# Patient Record
Sex: Female | Born: 1985 | Race: White | Hispanic: No | Marital: Married | Smoking: Current some day smoker
Health system: Southern US, Community
[De-identification: ages and names within clinical notes are randomized; demographics above are authoritative.]

## PROBLEM LIST (undated history)

## (undated) DIAGNOSIS — E0500 Thyrotoxicosis with diffuse goiter without thyrotoxic crisis or storm: Secondary | ICD-10-CM

## (undated) HISTORY — PX: TONSILLECTOMY: SUR1361

---

## 2017-09-12 ENCOUNTER — Ambulatory Visit
Admission: EM | Admit: 2017-09-12 | Discharge: 2017-09-12 | Disposition: A | Discharge status: Home / Self Care | Payer: 59 | Attending: Emergency Medicine | Admitting: Emergency Medicine

## 2017-09-12 ENCOUNTER — Encounter: Payer: Self-pay | Admitting: Emergency Medicine

## 2017-09-12 ENCOUNTER — Other Ambulatory Visit: Payer: Self-pay

## 2017-09-12 DIAGNOSIS — N390 Urinary tract infection, site not specified: Secondary | ICD-10-CM

## 2017-09-12 HISTORY — DX: Thyrotoxicosis with diffuse goiter without thyrotoxic crisis or storm: E05.00

## 2017-09-12 LAB — URINALYSIS, COMPLETE (UACMP) WITH MICROSCOPIC
BACTERIA UA: NONE SEEN
Bilirubin Urine: NEGATIVE
GLUCOSE, UA: NEGATIVE mg/dL
Hgb urine dipstick: NEGATIVE
KETONES UR: NEGATIVE mg/dL
Nitrite: NEGATIVE
PROTEIN: NEGATIVE mg/dL
RBC / HPF: NONE SEEN RBC/hpf (ref 0–5)
Specific Gravity, Urine: 1.01 (ref 1.005–1.030)
pH: 7 (ref 5.0–8.0)

## 2017-09-12 MED ORDER — PHENAZOPYRIDINE HCL 200 MG PO TABS: 200.0000 mg | ORAL_TABLET | Freq: Three times a day (TID) | ORAL | 0 refills | Status: DC | PRN

## 2017-09-12 MED ORDER — NITROFURANTOIN MONOHYD MACRO 100 MG PO CAPS: 100.0000 mg | ORAL_CAPSULE | Freq: Two times a day (BID) | ORAL | 0 refills | Status: DC

## 2017-09-12 NOTE — ED Triage Notes (Signed)
Patient c/o burning when urinating since Tuesday.  

## 2017-09-12 NOTE — ED Provider Notes (Signed)
HPI  SUBJECTIVE:  Kerry BlonderVannessa Reed is a 32 y.o. female who presents with dysuria, cloudy and odorous urine for the past 2 days.  Tried increasing her fluids with improvement in her symptoms.  Symptoms worse with urination.  She denies urgency, frequency, hematuria.  She reports intermittent low midline nonradiating, crampy abdominal pain last night which has since resolved.  No vaginal odor, discharge, bleeding, genital rash, itching.  No back, pelvic pain.  No fevers.  She is in a long-term monogamous relationship with a female who is asymptomatic.  STDs are not a concern today.  No recent antibiotics, perfumed soaps, antipyretic in the past 6-8 hours.  She has a past medical history of UTIs, nonobstructing nephrolithiasis, Graves' disease.  No history of pyelonphritis, diabetes, hypertension.  No history of gonorrhea, chlamydia, HIV, HSV, syphilis, Trichomonas, BV.  She has had yeast infections before.  States this feels identical to previous UTIs.  LMP: 12/30.  Denies possibility being pregnant.  JXB:JYNWGNPMD:[place], [facility]     Past Medical History:  Diagnosis Date  . Graves disease     Past Surgical History:  Procedure Laterality Date  . TONSILLECTOMY      Family History  Problem Relation Age of Onset  . Thyroid disease Mother   . Mitral valve prolapse Mother     Social History   Tobacco Use  . Smoking status: Current Some Day Smoker    Types: E-cigarettes  . Smokeless tobacco: Never Used  Substance Use Topics  . Alcohol use: No    Frequency: Never  . Drug use: No    No current facility-administered medications for this encounter.  No current outpatient medications on file.  Allergies  Allergen Reactions  . Augmentin [Amoxicillin-Pot Clavulanate] Other (See Comments)    unknown  . Bactrim [Sulfamethoxazole-Trimethoprim] Other (See Comments)    unknown  . Erythromycin Other (See Comments)    unknown     ROS  As noted in HPI.   Physical Exam  BP 112/74 (BP  Location: Left Arm)   Pulse 74   Temp 98.2 F (36.8 C) (Oral)   Resp 14   Ht 5\' 7"  (1.702 m)   Wt 154 lb (69.9 kg)   LMP 08/26/2017 (Approximate)   SpO2 100%   BMI 24.12 kg/m   Constitutional: Well developed, well nourished, no acute distress Eyes:  EOMI, conjunctiva normal bilaterally HENT: Normocephalic, atraumatic,mucus membranes moist Respiratory: Normal inspiratory effort Cardiovascular: Normal rate GI: nondistended soft, no suprapubic or flank tenderness Back: No CVA tenderness skin: No rash, skin intact Musculoskeletal: no deformities Neurologic: Alert & oriented x 3, no focal neuro deficits Psychiatric: Speech and behavior appropriate   ED Course   Medications - No data to display  Orders Placed This Encounter  Procedures  . Urinalysis, Complete w Microscopic    Standing Status:   Standing    Number of Occurrences:   1    Results for orders placed or performed during the hospital encounter of 09/12/17 (from the past 24 hour(s))  Urinalysis, Complete w Microscopic     Status: Abnormal   Collection Time: 09/12/17  8:15 AM  Result Value Ref Range   Color, Urine YELLOW YELLOW   APPearance CLEAR CLEAR   Specific Gravity, Urine 1.010 1.005 - 1.030   pH 7.0 5.0 - 8.0   Glucose, UA NEGATIVE NEGATIVE mg/dL   Hgb urine dipstick NEGATIVE NEGATIVE   Bilirubin Urine NEGATIVE NEGATIVE   Ketones, ur NEGATIVE NEGATIVE mg/dL   Protein, ur NEGATIVE  NEGATIVE mg/dL   Nitrite NEGATIVE NEGATIVE   Leukocytes, UA TRACE (A) NEGATIVE   Squamous Epithelial / LPF 0-5 (A) NONE SEEN   WBC, UA 0-5 0 - 5 WBC/hpf   RBC / HPF NONE SEEN 0 - 5 RBC/hpf   Bacteria, UA NONE SEEN NONE SEEN   No results found.  ED Clinical Impression  Urinary tract infection without hematuria, site unspecified   ED Assessment/Plan  Patient with trace leukocytes, negative nitrite and no bacteria.  However given her symptoms, we will treat this as if this is a UTI.  Home with Macrobid, Pyridium,  continue pushing fluids, ibuprofen/Tylenol as needed.  If she does not get better, she may return here and we can consider other causes of dysuria such as gynecologic infections at that time.  Will send this off for culture to confirm presence of UTI and sensitivities.  Discussed this with patient.  She is amenable to this plan  Discussed labs, MDM, plan and followup with patient. Discussed sn/sx that should prompt return to the ED. patient agrees with plan.   No orders of the defined types were placed in this encounter.   *This clinic note was created using Dragon dictation software. Therefore, there may be occasional mistakes despite careful proofreading.   ?   Domenick Gong, MD 09/12/17 [phone]

## 2017-09-14 LAB — URINE CULTURE
Culture: 50000 — AB
Special Requests: NORMAL

## 2017-11-06 ENCOUNTER — Other Ambulatory Visit: Payer: Self-pay | Admitting: Family Medicine

## 2017-11-06 ENCOUNTER — Ambulatory Visit
Admission: RE | Admit: 2017-11-06 | Discharge: 2017-11-06 | Disposition: A | Discharge status: Home / Self Care | Payer: 59 | Source: Ambulatory Visit | Attending: Family Medicine | Admitting: Family Medicine

## 2017-11-06 DIAGNOSIS — M542 Cervicalgia: Secondary | ICD-10-CM

## 2018-04-28 ENCOUNTER — Encounter: Payer: Self-pay | Admitting: Emergency Medicine

## 2018-04-28 ENCOUNTER — Ambulatory Visit
Admission: EM | Admit: 2018-04-28 | Discharge: 2018-04-28 | Disposition: A | Discharge status: Home / Self Care | Payer: 59 | Attending: Family Medicine | Admitting: Family Medicine

## 2018-04-28 ENCOUNTER — Other Ambulatory Visit: Payer: Self-pay

## 2018-04-28 DIAGNOSIS — R350 Frequency of micturition: Secondary | ICD-10-CM | POA: Diagnosis not present

## 2018-04-28 DIAGNOSIS — R300 Dysuria: Secondary | ICD-10-CM | POA: Diagnosis not present

## 2018-04-28 LAB — URINALYSIS, COMPLETE (UACMP) WITH MICROSCOPIC
Bacteria, UA: NONE SEEN
Bilirubin Urine: NEGATIVE
Glucose, UA: NEGATIVE mg/dL
Ketones, ur: NEGATIVE mg/dL
Nitrite: NEGATIVE
Protein, ur: NEGATIVE mg/dL
RBC / HPF: NONE SEEN RBC/hpf (ref 0–5)
Specific Gravity, Urine: <1.005 — ABNORMAL LOW (ref 1.005–1.030)
Squamous Epithelial / LPF: NONE SEEN (ref 0–5)
pH: 5.5 (ref 5.0–8.0)

## 2018-04-28 MED ORDER — NITROFURANTOIN MONOHYD MACRO 100 MG PO CAPS: 100.0000 mg | ORAL_CAPSULE | Freq: Two times a day (BID) | ORAL | 0 refills | Status: AC

## 2018-04-28 NOTE — ED Provider Notes (Signed)
[facility] ____________________________________________  Time seen: Approximately 11:03 AM  I have reviewed the triage vital signs and the nursing notes.   HISTORY  Chief Complaint Dysuria   HPI Kerry Reed is a 32 y.o. female presenting for evaluation of dysuria present for the last 2 days.  Describes dysuria symptoms including burning and painful urination, slight urinary frequency.  States does often have some low back pain but denies any acute changes to low back pain.  Denies abdominal pain, fevers, vomiting or diarrhea.  Reports recent sexual intercourse with her spouse and states that she does not believe that she urinated afterwards and concerned this may have led to UTI.  States history of similar with similar presentation in similar circumstances.  States urine also smells odorous.  Denies any vaginal discharge, vaginal discomfort, vaginal sores, concerns of STDs or vaginal odor.  Denies recent antibiotic use.  Denies other aggravating alleviating factors.  Reports otherwise feels well.  Patient's last menstrual period was 04/14/2018 (approximate).Denies pregnancy.   Past Medical History:  Diagnosis Date  . Graves disease     There are no active problems to display for this patient.   Past Surgical History:  Procedure Laterality Date  . TONSILLECTOMY       No current facility-administered medications for this encounter.   Current Outpatient Medications:  .  nitrofurantoin, macrocrystal-monohydrate, (MACROBID) 100 MG capsule, Take 1 capsule (100 mg total) by mouth 2 (two) times daily., Disp: 10 capsule, Rfl: 0  Allergies Augmentin [amoxicillin-pot clavulanate]; Bactrim [sulfamethoxazole-trimethoprim]; Ceclor [cefaclor]; and Erythromycin  Family History  Problem Relation Age of Onset  . Thyroid disease Mother   . Mitral valve prolapse Mother     Social History Social History   Tobacco Use  . Smoking status: Current Some Day Smoker   Types: E-cigarettes, Cigarettes  . Smokeless tobacco: Never Used  Substance Use Topics  . Alcohol use: Yes    Frequency: Never    Comment: occasionally   . Drug use: No    Review of Systems Constitutional: No fever/chills Cardiovascular: Denies chest pain. Respiratory: Denies shortness of breath. Gastrointestinal: No abdominal pain.  No nausea, no vomiting.  No diarrhea.  No constipation. Genitourinary: positive for dysuria. Musculoskeletal: Negative for back pain. Skin: Negative for rash.  ____________________________________________   PHYSICAL EXAM:  VITAL SIGNS: ED Triage Vitals  Enc Vitals Group     BP 04/28/18 1007 112/77     Pulse Rate 04/28/18 1007 72     Resp 04/28/18 1007 17     Temp 04/28/18 1007 98.1 F (36.7 C)     Temp Source 04/28/18 1007 Oral     SpO2 04/28/18 1007 100 %     Weight 04/28/18 1006 153 lb (69.4 kg)     Height 04/28/18 1006 5\' 7"  (1.702 m)     Head Circumference --      Peak Flow --      Pain Score 04/28/18 1005 3     Pain Loc --      Pain Edu? --      Excl. in GC? --     Constitutional: Alert and oriented. Well appearing and in no acute distress. ENT      Head: Normocephalic and atraumatic. Cardiovascular: Normal rate, regular rhythm. Grossly normal heart sounds.  Good peripheral circulation. Respiratory: Normal respiratory effort without tachypnea nor retractions. Breath sounds are clear and equal bilaterally. No wheezes, rales, rhonchi. Gastrointestinal: Soft and nontender.  No CVA tenderness. Musculoskeletal: No midline cervical, thoracic or  lumbar tenderness to palpation Neurologic:  Normal speech and language.Speech is normal. No gait instability.  Skin:  Skin is warm, dry and intact. No rash noted. Psychiatric: Mood and affect are normal. Speech and behavior are normal. Patient exhibits appropriate insight and judgment   ___________________________________________   LABS (all labs ordered are listed, but only abnormal results  are displayed)  Labs Reviewed  URINALYSIS, COMPLETE (UACMP) WITH MICROSCOPIC - Abnormal; Notable for the following components:      Result Value   Color, Urine STRAW (*)    Specific Gravity, Urine <1.005 (*)    Hgb urine dipstick TRACE (*)    Leukocytes, UA MODERATE (*)    All other components within normal limits  URINE CULTURE    PROCEDURES Procedures   INITIAL IMPRESSION / ASSESSMENT AND PLAN / ED COURSE  Pertinent labs & imaging results that were available during my care of the patient were reviewed by me and considered in my medical decision making (see chart for details).  Well-appearing patient.  No acute distress.  Dysuria symptoms.  Urinalysis reviewed, discussed with patient not clear UTI, will culture urine.  Patient denies any vaginal complaints.  Suspicious for UTI.  Will empirically start oral Macrobid and await urine culture.  Encourage rest, fluids, supportive care.Discussed indication, risks and benefits of medications with patient.  Discussed follow up with Primary care physician this week. Discussed follow up and return parameters including no resolution or any worsening concerns. Patient verbalized understanding and agreed to plan.   ____________________________________________   FINAL CLINICAL IMPRESSION(S) / ED DIAGNOSES  Final diagnoses:  Dysuria     ED Discharge Orders         Ordered    nitrofurantoin, macrocrystal-monohydrate, (MACROBID) 100 MG capsule  2 times daily     04/28/18 1045           Note: This dictation was prepared with Dragon dictation along with smaller phrase technology. Any transcriptional errors that result from this process are unintentional.         Renford Dills, NP 04/28/18 1106

## 2018-04-28 NOTE — ED Triage Notes (Signed)
Patient c/o dysuria that started 2 days ago.

## 2018-04-28 NOTE — Discharge Instructions (Addendum)
Take medication as prescribed. Rest. Drink plenty of fluids.  ° °Follow up with your primary care physician this week as needed. Return to Urgent care for new or worsening concerns.  ° °

## 2018-05-01 ENCOUNTER — Telehealth (HOSPITAL_COMMUNITY): Payer: Self-pay

## 2018-05-01 LAB — URINE CULTURE: Culture: >=100000 — AB

## 2018-05-01 NOTE — Telephone Encounter (Signed)
Urine culture positive for e.coli this was treated with macrobid at ucc visit. Pt called and made aware.

## 2019-01-18 IMAGING — CR DG CERVICAL SPINE FLEX&EXT ONLY
3 series · 3 of 3 positions shown · non-contrast
Comparison: None.

CLINICAL DATA: Neck pain, bilateral.

EXAM:
CERVICAL SPINE - 2-3 VIEW; CERVICAL SPINE - FLEXION AND EXTENSION
VIEWS ONLY

[ct-spine lat]
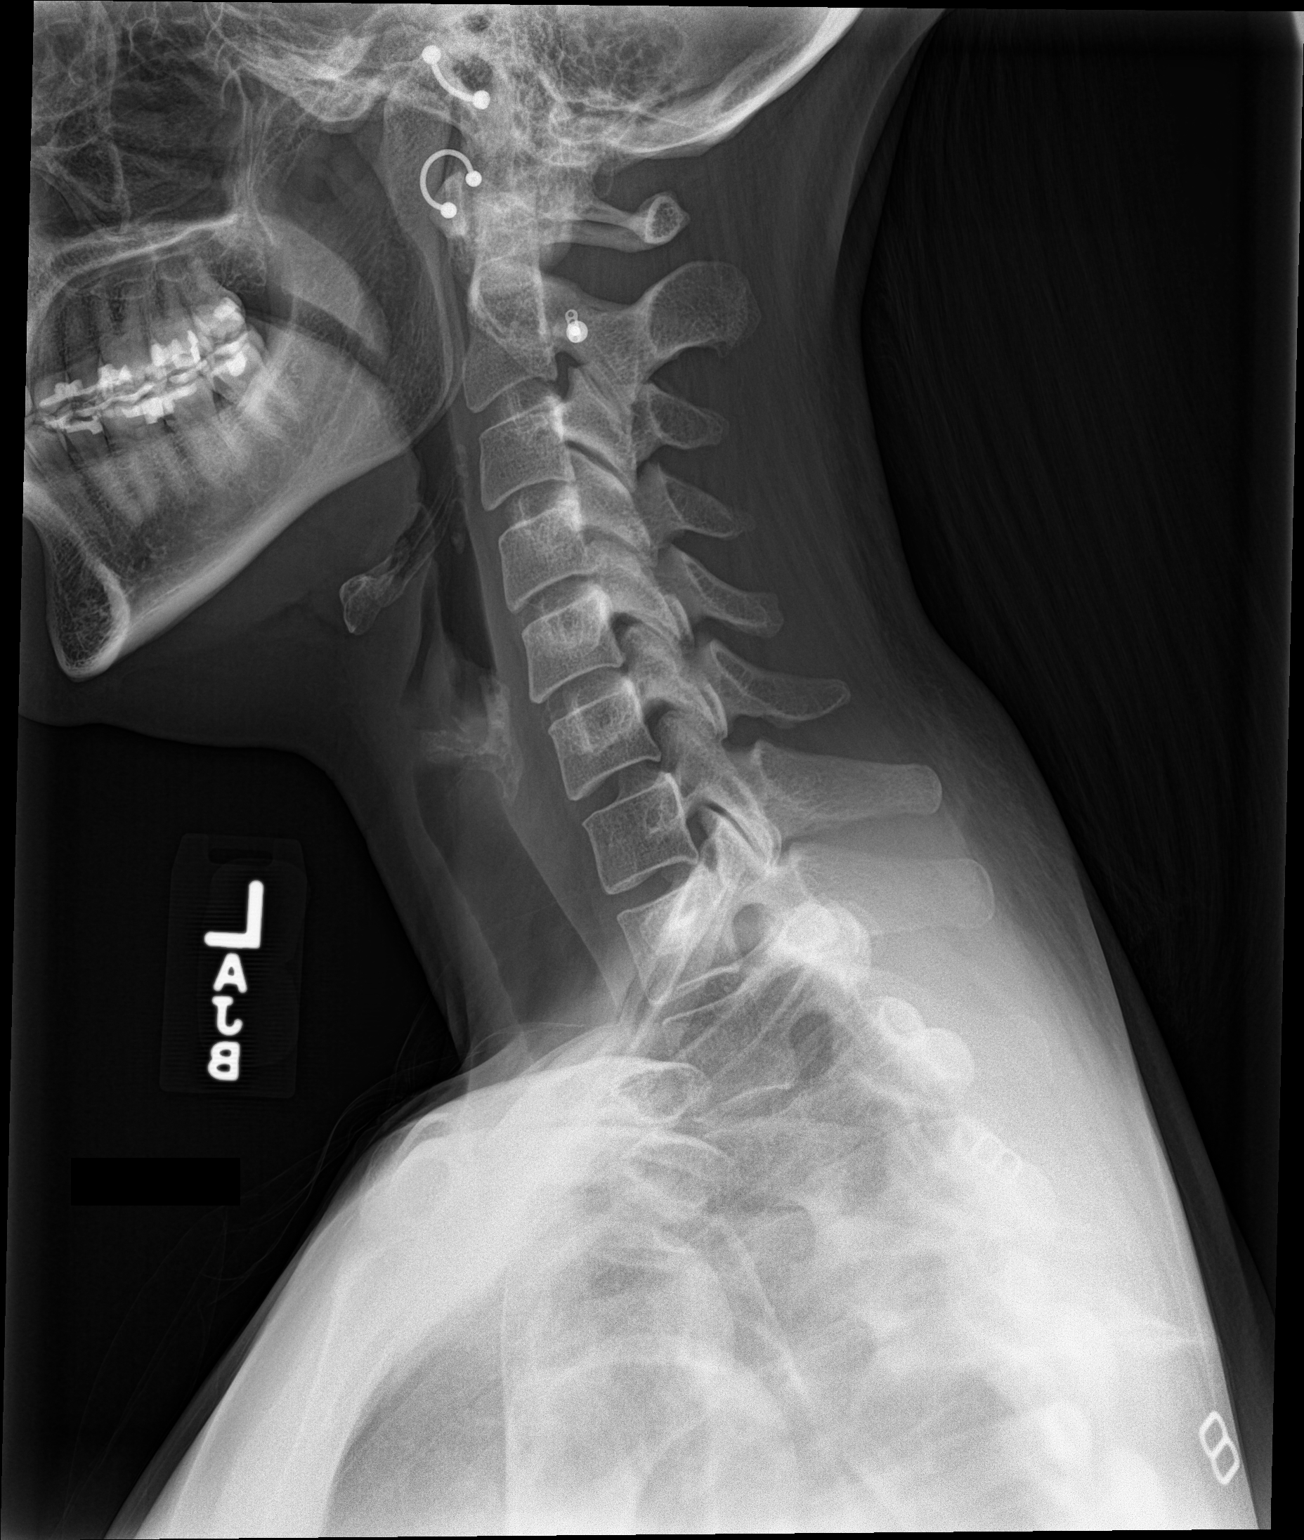

[c-spine flex]
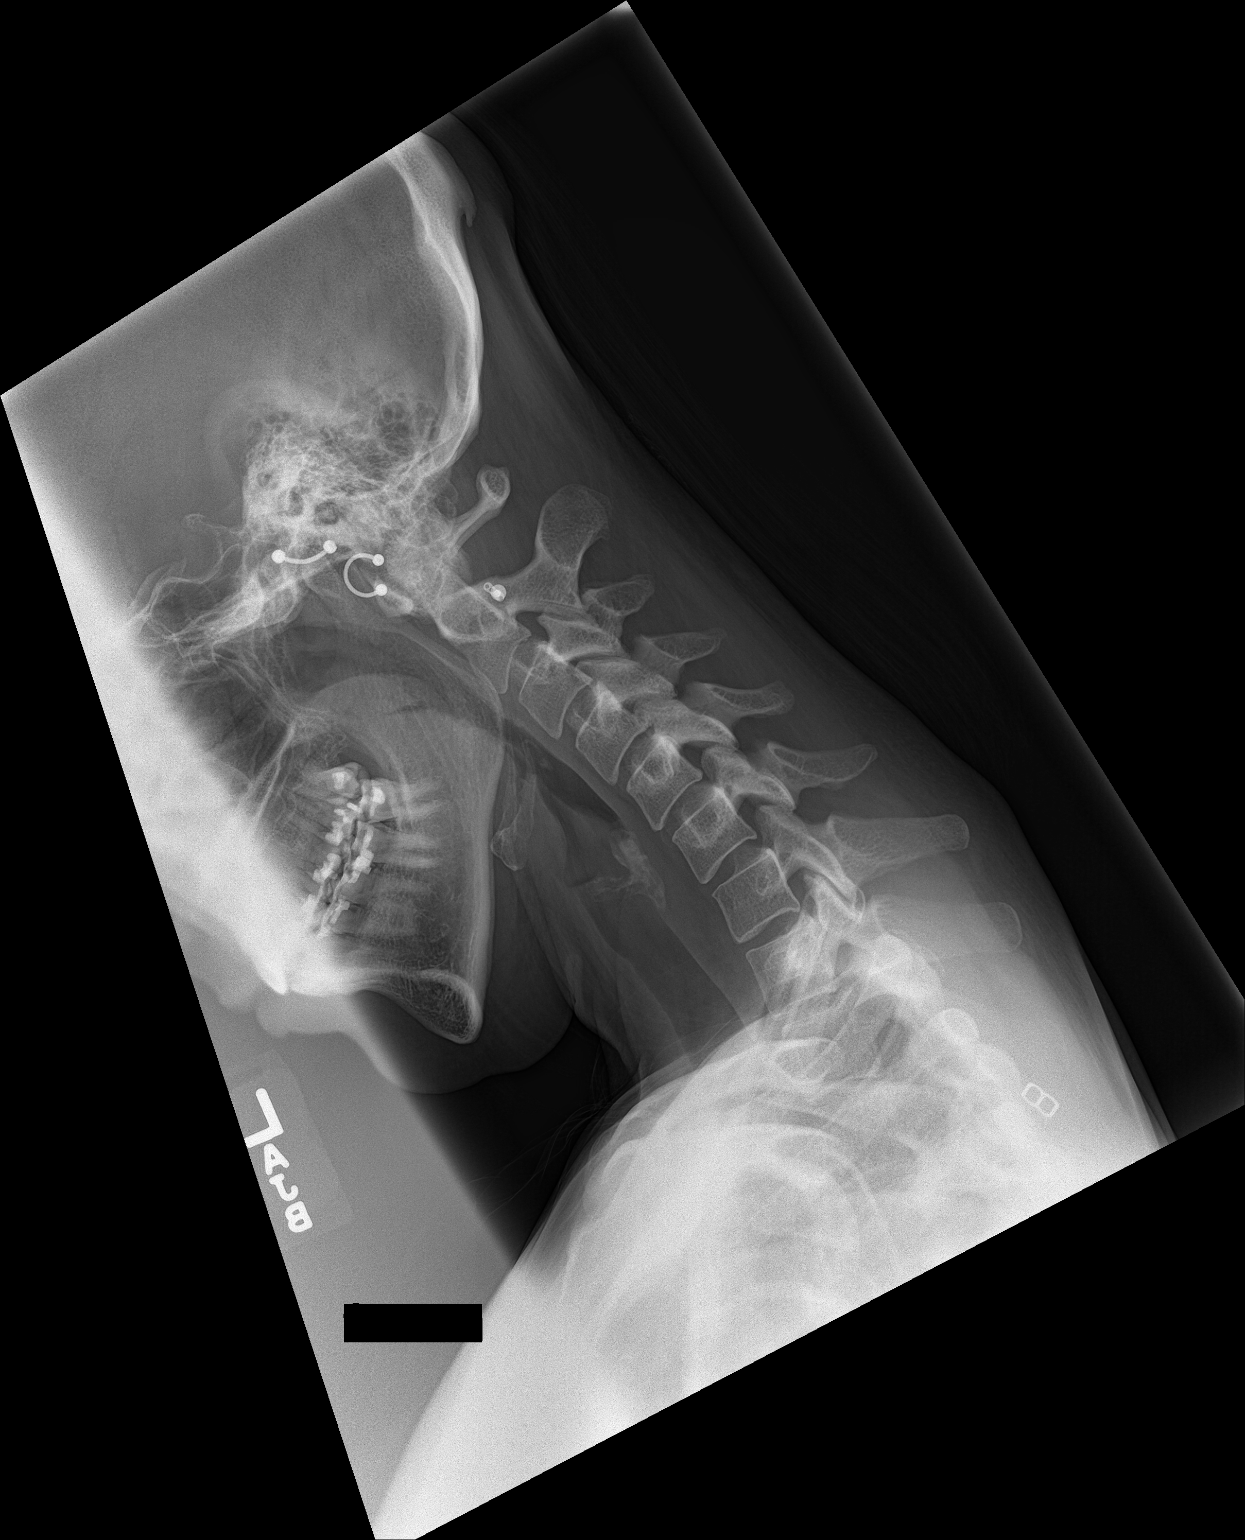

[c-spine ext]
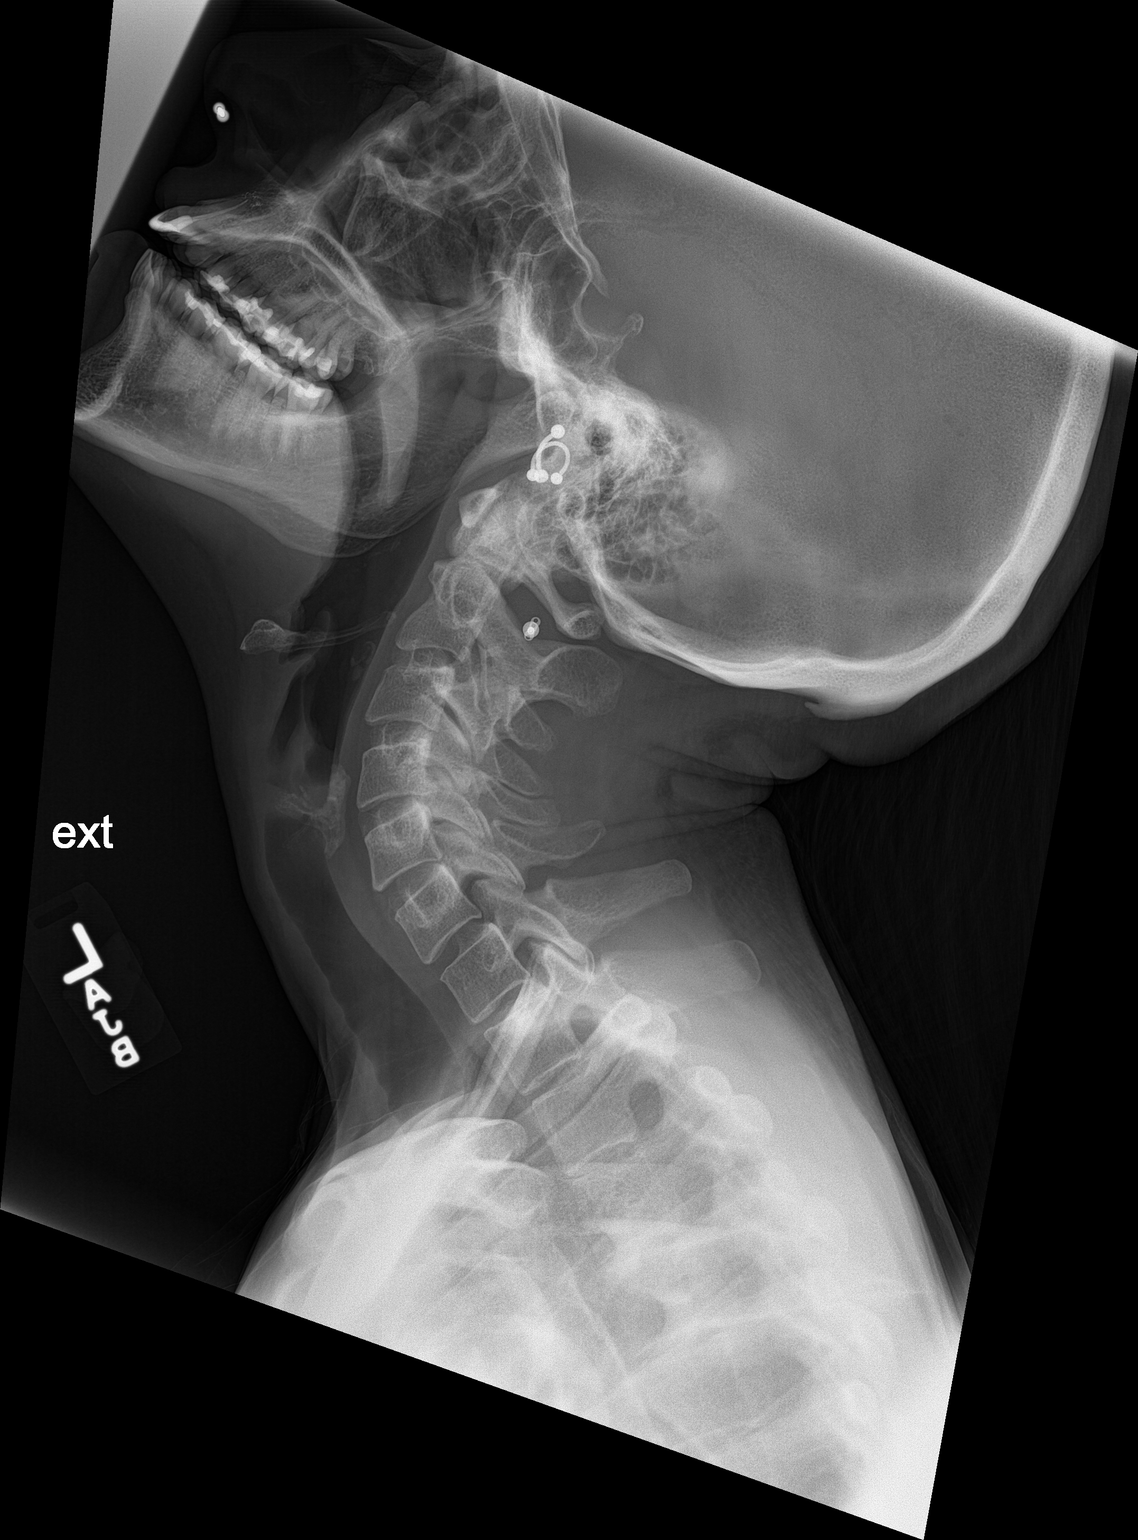

[3 of 3 positions shown; findings below may reference images not displayed]

FINDINGS: Cervical spine is visualized the skull base through the
cervicothoracic junction. Vertebral body heights and disc spaces are
maintained. No acute fracture traumatic subluxation is present.
There is no abnormal motion through a normal range of flexion and
extension. The lung apices are clear.
IMPRESSION: Negative cervical spine radiographs including flexion and extension
views.

## 2024-07-16 ENCOUNTER — Other Ambulatory Visit: Payer: Self-pay | Admitting: Certified Nurse Midwife

## 2024-07-16 DIAGNOSIS — Z1231 Encounter for screening mammogram for malignant neoplasm of breast: Secondary | ICD-10-CM

## 2024-09-05 ENCOUNTER — Ambulatory Visit
Admission: EM | Admit: 2024-09-05 | Discharge: 2024-09-05 | Disposition: A | Discharge status: Home / Self Care | Attending: Family Medicine | Admitting: Family Medicine

## 2024-09-05 DIAGNOSIS — Z113 Encounter for screening for infections with a predominantly sexual mode of transmission: Secondary | ICD-10-CM | POA: Diagnosis present

## 2024-09-05 NOTE — ED Triage Notes (Signed)
 Patient is here for STD testing with swab and blood. No concerns.

## 2024-09-05 NOTE — ED Provider Notes (Signed)
 " [facility]   [id number] 09/05/24 Arrival Time: 1241  ASSESSMENT & PLAN:  1. Screening for STDs (sexually transmitted diseases)       Discharge Instructions      We have sent testing for sexually transmitted infections. We will notify you of any positive results once they are received. If required, we will prescribe any medications you might need.  Please refrain from all sexual activity for at least the next seven days.      Labs Pending:  SYPHILIS: RPR W/REFLEX TO RPR TITER AND TREPONEMAL ANTIBODIES, TRADITIONAL SCREENING AND DIAGNOSIS ALGORITHM  HIV ANTIBODY (ROUTINE TESTING W REFLEX)  CERVICOVAGINAL ANCILLARY ONLY     Reviewed expectations re: course of current medical issues. Questions answered. Outlined signs and symptoms indicating need for more acute intervention. Patient verbalized understanding. After Visit Summary given.   SUBJECTIVE:  Kerry Reed is a 39 y.o. female who requests STD testing. No symptoms.  Patient's last menstrual period was 09/01/2024 (exact date).   OBJECTIVE:  Vitals:   09/05/24 1247 09/05/24 1248  BP:  110/77  Pulse:  94  Resp:  17  Temp:  98.3 F (36.8 C)  TempSrc:  Oral  SpO2:  98%  Weight: 74.8 kg     General appearance: alert, cooperative, appears stated age and no distress Psychological: alert and cooperative; normal mood and affect.    Labs Reviewed  SYPHILIS: RPR W/REFLEX TO RPR TITER AND TREPONEMAL ANTIBODIES, TRADITIONAL SCREENING AND DIAGNOSIS ALGORITHM  HIV ANTIBODY (ROUTINE TESTING W REFLEX)  CERVICOVAGINAL ANCILLARY ONLY    Allergies[1]  Past Medical History:  Diagnosis Date   Graves disease    Family History  Problem Relation Age of Onset   Thyroid disease Mother    Mitral valve prolapse Mother    Social History   Socioeconomic History   Marital status: Married    Spouse name: Not on file   Number of children: Not on file   Years of education: Not on file   Highest  education level: Not on file  Occupational History   Not on file  Tobacco Use   Smoking status: Some Days    Types: E-cigarettes, Cigarettes   Smokeless tobacco: Never  Vaping Use   Vaping status: Some Days  Substance and Sexual Activity   Alcohol use: Yes    Comment: occasionally    Drug use: No   Sexual activity: Not on file  Other Topics Concern   Not on file  Social History Narrative   Not on file   Social Drivers of Health   Tobacco Use: High Risk (09/05/2024)   Patient History    Smoking Tobacco Use: Some Days    Smokeless Tobacco Use: Never    Passive Exposure: Not on file  Financial Resource Strain: Low Risk  (07/16/2024)   Received from [facility]   Overall Financial Resource Strain (CARDIA)    Difficulty of Paying Living Expenses: Not hard at all  Food Insecurity: No Food Insecurity (07/16/2024)   Received from [facility]   Epic    Within the past 12 months, you worried that your food would run out before you got the money to buy more.: Never true    Within the past 12 months, the food you bought just didn't last and you didn't have money to get more.: Never true  Transportation Needs: No Transportation Needs (07/16/2024)   Received from [facility]   [facility] - Transportation  In the past 12 months, has lack of transportation kept you from medical appointments or from getting medications?: No    Lack of Transportation (Non-Medical): No  Physical Activity: Not on file  Stress: Not on file  Social Connections: Not on file  Intimate Partner Violence: Not on file  Depression (EYV7-0): Not on file  Alcohol Screen: Not on file  Housing: High Risk (07/16/2024)   Received from [facility]   Epic    In the last 12 months, was there a time when you were not able to pay the mortgage or rent on time?: Yes    In the past 12 months, how many times have you moved where you were living?: 0    At  any time in the past 12 months, were you homeless or living in a shelter (including now)?: No  Utilities: Not At Risk (07/16/2024)   Received from [facility]   Epic    In the past 12 months has the electric, gas, oil, or water company threatened to shut off services in your home?: No  Health Literacy: Not on file            [1]  Allergies Allergen Reactions   Augmentin [Amoxicillin-Pot Clavulanate] Other (See Comments)    unknown   Bactrim [Sulfamethoxazole-Trimethoprim] Other (See Comments)    unknown   Ceclor [Cefaclor]    Erythromycin Other (See Comments)    unknown     Rolinda Rogue, MD 09/05/24 1307  "

## 2024-09-05 NOTE — Discharge Instructions (Signed)
 We have sent testing for sexually transmitted infections. We will notify you of any positive results once they are received. If required, we will prescribe any medications you might need.  Please refrain from all sexual activity for at least the next seven days.

## 2024-09-06 LAB — HIV ANTIBODY (ROUTINE TESTING W REFLEX): HIV Screen 4th Generation wRfx: NONREACTIVE

## 2024-09-06 LAB — SYPHILIS: RPR W/REFLEX TO RPR TITER AND TREPONEMAL ANTIBODIES, TRADITIONAL SCREENING AND DIAGNOSIS ALGORITHM: RPR Ser Ql: NONREACTIVE

## 2024-09-07 LAB — CERVICOVAGINAL ANCILLARY ONLY
Bacterial Vaginitis (gardnerella): NEGATIVE
Candida Glabrata: NEGATIVE
Candida Vaginitis: NEGATIVE
Chlamydia: NEGATIVE
Comment: NEGATIVE
Comment: NEGATIVE
Comment: NEGATIVE
Comment: NEGATIVE
Comment: NEGATIVE
Comment: NORMAL
Neisseria Gonorrhea: NEGATIVE
Trichomonas: NEGATIVE
